# Patient Record
Sex: Male | Born: 1990 | Race: White | Hispanic: No | Marital: Single | Smoking: Never smoker
Health system: Southern US, Community
[De-identification: ages and names within clinical notes are randomized; demographics above are authoritative.]

## PROBLEM LIST (undated history)

## (undated) HISTORY — PX: FRACTURE SURGERY: SHX138

## (undated) HISTORY — PX: CHOLECYSTECTOMY: SHX55

## (undated) HISTORY — PX: TONSILLECTOMY: SUR1361

---

## 2019-07-10 ENCOUNTER — Ambulatory Visit
Admission: EM | Admit: 2019-07-10 | Discharge: 2019-07-10 | Disposition: A | Discharge status: Home / Self Care | Payer: BC Managed Care – PPO | Attending: Family Medicine | Admitting: Family Medicine

## 2019-07-10 ENCOUNTER — Encounter: Payer: Self-pay | Admitting: Emergency Medicine

## 2019-07-10 ENCOUNTER — Other Ambulatory Visit: Payer: Self-pay

## 2019-07-10 ENCOUNTER — Ambulatory Visit (INDEPENDENT_AMBULATORY_CARE_PROVIDER_SITE_OTHER): Payer: BC Managed Care – PPO

## 2019-07-10 DIAGNOSIS — S93601A Unspecified sprain of right foot, initial encounter: Secondary | ICD-10-CM

## 2019-07-10 NOTE — Discharge Instructions (Addendum)
Apply ice 20 minutes out of every 2 hours 4-5 times daily for comfort.  Feet your foot above the level of your heart sufficiently to control swelling and pain.  Take ibuprofen as necessary to control your pain.  If you continue to have symptoms make and appointment to follow-up with [facility]

## 2019-07-10 NOTE — ED Provider Notes (Signed)
[facility]    CSN: [id number] Arrival date & time: 07/10/19  1434      History   Chief Complaint Chief Complaint  Patient presents with  . Ankle Pain    right    HPI Melvin Garner is a 29 y.o. male.   HPI   29 year old male states that 10 days ago he was walking in the parking lot and suddenly twisted his right ankle into inversion. He does not know what he may have stepped on but found that he was on the ground and his ankle area was hurting very badly. 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 Then his ankle has been hurting him worse. Indicates that his pain is mostly at the base of the fifth metatarsal. Some mild swelling in this area which is slightly superior to the base of the fifth metatarsal.       History reviewed. No pertinent past medical history.  There are no problems to display for this patient.   Past Surgical History:  Procedure Laterality Date  . CHOLECYSTECTOMY    . FRACTURE SURGERY    . TONSILLECTOMY         Home Medications    Prior to Admission medications   Not on File    Family History Family History  Problem Relation Age of Onset  . Rheum arthritis Mother   . Diabetes Mother   . Fibromyalgia Mother   . Hyperlipidemia Father     Social History Social History   Tobacco Use  . Smoking status: Never Smoker  . Smokeless tobacco: Never Used  Substance Use Topics  . Alcohol use: Not Currently  . Drug use: Never     Allergies   Patient has no known allergies.   Review of Systems Review of Systems  Constitutional: Positive for activity change. Negative for appetite change, chills, diaphoresis, fatigue and fever.  Musculoskeletal: Positive for arthralgias and gait problem.  All other systems reviewed and are negative.    Physical Exam Triage Vital Signs ED Triage  Vitals  Enc Vitals Group     BP 07/10/19 1450 (!) 169/118     Pulse Rate 07/10/19 1450 (!) 105     Resp 07/10/19 1450 17     Temp 07/10/19 1450 99.1 F (37.3 C)     Temp Source 07/10/19 1450 Oral     SpO2 07/10/19 1450 100 %     Weight 07/10/19 1447 (!) 365 lb (165.6 kg)     Height 07/10/19 1447 5\' 11"  (1.803 m)     Head Circumference --      Peak Flow --      Pain Score 07/10/19 1447 5     Pain Loc --      Pain Edu? --      Excl. in GC? --    No data found.  Updated Vital Signs BP (!) 169/118 (BP Location: Left Arm)   Pulse (!) 105   Temp 99.1 F (37.3 C) (Oral)   Resp 17   Ht 5\' 11"  (1.803 m)   Wt (!) 365 lb (165.6 kg)   SpO2 100%   BMI 50.91 kg/m   Visual Acuity Right Eye Distance:   Left Eye Distance:   Bilateral Distance:    Right Eye Near:   Left Eye Near:    Bilateral  Near:     Physical Exam Vitals and nursing note reviewed.  Constitutional:      General: He is not in acute distress.    Appearance: Normal appearance. He is obese. He is not ill-appearing or toxic-appearing.  HENT:     Head: Normocephalic and atraumatic.  Eyes:     Conjunctiva/sclera: Conjunctivae normal.  Musculoskeletal:        General: Swelling, tenderness and signs of injury present.     Cervical back: Normal range of motion and neck supple.     Comments: Examination of the right foot and ankle shows swelling just superior to the is of the fifth metatarsal and fourth metatarsal. Has good range of motion of the ankle and no tenderness to the lateral or medial malleolus. There is no discomfort with the compression of the distal tib-fib. No tenderness along the fifth metatarsal the fourth metatarsal but over the lateral cuboid to be where his tenderness is concentrated.  Skin:    General: Skin is warm and dry.  Neurological:     General: No focal deficit present.     Mental Status: He is alert and oriented to person, place, and time.  Psychiatric:        Mood and Affect: Mood normal.         Behavior: Behavior normal.        Thought Content: Thought content normal.        Judgment: Judgment normal.      UC Treatments / Results  Labs (all labs ordered are listed, but only abnormal results are displayed) Labs Reviewed - No data to display  EKG   Radiology DG Foot Complete Right  Result Date: 07/10/2019 CLINICAL DATA:  Pain following inversion type injury EXAM: RIGHT FOOT COMPLETE - 3+ VIEW COMPARISON:  None. FINDINGS: Frontal, oblique, and lateral views were obtained. No fracture or dislocation. Joint spaces appear normal. No erosive change. IMPRESSION: No fracture or dislocation.  No evident arthropathy. Electronically Signed   By: Lowella Grip III M.D.   On: 07/10/2019 15:50    Procedures Procedures (including critical care time)  Medications Ordered in UC Medications - No data to display  Initial Impression / Assessment and Plan / UC Course  I have reviewed the triage vital signs and the nursing notes.  Pertinent labs & imaging results that were available during my care of the patient were reviewed by me and considered in my medical decision making (see chart for details).   29 year old male was walking in a parking lot about 10 days ago when he twisted his right ankle into inversion.  Since that time has been complaining of pain in his right ankle but actually indicates the base of the fifth metatarsal cuboid area.  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   Final Clinical Impressions(s) / UC Diagnoses   Final diagnoses:  Sprain of right foot, initial encounter     Discharge Instructions     Apply ice 20 minutes out of every 2 hours 4-5 times daily for comfort.  Feet your foot above the level of your heart sufficiently to control swelling and pain.  Take ibuprofen as necessary to control your pain.  If you continue to have symptoms make  and appointment to follow-up with [facility]    ED Prescriptions    None     PDMP not reviewed this encounter.  Lutricia Feil, PA-C 07/10/19 1956

## 2019-07-10 NOTE — ED Triage Notes (Signed)
Patient states that he was walking in the parking lot over a week and twisted his right ankle.  Patient c/o ongoing pain in his right ankle.

## 2020-12-01 ENCOUNTER — Emergency Department
Admission: EM | Admit: 2020-12-01 | Discharge: 2020-12-01 | Disposition: A | Discharge status: Home / Self Care | Payer: BC Managed Care – PPO | Attending: Emergency Medicine | Admitting: Emergency Medicine

## 2020-12-01 ENCOUNTER — Other Ambulatory Visit: Payer: Self-pay

## 2020-12-01 DIAGNOSIS — Z5321 Procedure and treatment not carried out due to patient leaving prior to being seen by health care provider: Secondary | ICD-10-CM | POA: Insufficient documentation

## 2020-12-01 DIAGNOSIS — Z8616 Personal history of COVID-19: Secondary | ICD-10-CM | POA: Insufficient documentation

## 2020-12-01 DIAGNOSIS — R197 Diarrhea, unspecified: Secondary | ICD-10-CM | POA: Insufficient documentation

## 2020-12-01 DIAGNOSIS — R42 Dizziness and giddiness: Secondary | ICD-10-CM | POA: Insufficient documentation

## 2020-12-01 LAB — COMPREHENSIVE METABOLIC PANEL
ALT: 32 U/L (ref 0–44)
AST: 24 U/L (ref 15–41)
Albumin: 4.3 g/dL (ref 3.5–5.0)
Alkaline Phosphatase: 79 U/L (ref 38–126)
Anion gap: 8 (ref 5–15)
BUN: 13 mg/dL (ref 6–20)
CO2: 27 mmol/L (ref 22–32)
Calcium: 9.3 mg/dL (ref 8.9–10.3)
Chloride: 103 mmol/L (ref 98–111)
Creatinine, Ser: 0.91 mg/dL (ref 0.61–1.24)
GFR, Estimated: >60 mL/min (ref >60)
Glucose, Bld: 87 mg/dL (ref 70–99)
Potassium: 3.8 mmol/L (ref 3.5–5.1)
Sodium: 138 mmol/L (ref 135–145)
Total Bilirubin: 0.7 mg/dL (ref 0.3–1.2)
Total Protein: 7.7 g/dL (ref 6.5–8.1)

## 2020-12-01 LAB — URINALYSIS, ROUTINE W REFLEX MICROSCOPIC
Bilirubin Urine: NEGATIVE
Glucose, UA: NEGATIVE mg/dL
Hgb urine dipstick: NEGATIVE
Ketones, ur: 5 mg/dL — AB
Leukocytes,Ua: NEGATIVE
Nitrite: NEGATIVE
Protein, ur: NEGATIVE mg/dL
Specific Gravity, Urine: 1.028 (ref 1.005–1.030)
pH: 5 (ref 5.0–8.0)

## 2020-12-01 LAB — CBC
HCT: 43.9 % (ref 39.0–52.0)
Hemoglobin: 14.8 g/dL (ref 13.0–17.0)
MCH: 30.1 pg (ref 26.0–34.0)
MCHC: 33.7 g/dL (ref 30.0–36.0)
MCV: 89.2 fL (ref 80.0–100.0)
Platelets: 324 10*3/uL (ref 150–400)
RBC: 4.92 MIL/uL (ref 4.22–5.81)
RDW: 13.1 % (ref 11.5–15.5)
WBC: 10.5 10*3/uL (ref 4.0–10.5)
nRBC: 0 % (ref 0.0–0.2)

## 2020-12-01 LAB — LIPASE, BLOOD: Lipase: 27 U/L (ref 11–51)

## 2020-12-01 NOTE — ED Triage Notes (Signed)
Pt comes with c/o diarrhea and some lightheaded. Pt also states trouble getting words out. Pt denies any belly pain.  Pt denies any recent injuries. Pt denies any CP or SOB. Pt speaking in complete sentences. Neuro neg .

## 2020-12-01 NOTE — ED Provider Notes (Signed)
Emergency Medicine Provider Triage Evaluation Note  Melvin Garner , a 30 y.o. male  was evaluated in triage.  Patient states 2 days ago he developed sudden onset of severe dizziness with a couple episodes of diarrhea.  Over the last couple days he has had some continued episodes of dizziness that are not quite as severe along with brain fog.  Patient denies any headaches, chest pain, shortness of breath.  He did have COVID 4 weeks ago.  Denies any abdominal pain nausea or vomiting.  Has not had any recent diarrhea.  Currently not dizzy Review of Systems  Positive: Dizziness, diarrhea, brain fog Negative: Fevers, chills, abdominal pain, nausea, vomiting, chest pain, shortness of breath, headache  Physical Exam  BP (!) 158/105 (BP Location: Left Arm)   Pulse 94   Temp 98.4 F (36.9 C) (Oral)   Resp 16   Ht 6' (1.829 m)   Wt (!) 188.2 kg   SpO2 100%   BMI 56.28 kg/m  Gen:   Awake, no distress alert and oriented x3 Resp:  Normal effort no respiratory distress MSK:   Moves extremities without difficulty  Other:    Medical Decision Making  Medically screening exam initiated at 6:34 PM.  Appropriate orders placed.  Leonidus Rowand was informed that the remainder of the evaluation will be completed by another provider, this initial triage assessment does not replace that evaluation, and the importance of remaining in the ED until their evaluation is complete.  Blood work, urinalysis and EKG pending.   Evon Slack, PA-C 12/01/20 1836    Merwyn Katos, MD 12/01/20 [phone]

## 2021-04-12 ENCOUNTER — Ambulatory Visit: Payer: BC Managed Care – PPO | Admitting: Dermatology

## 2021-04-13 ENCOUNTER — Other Ambulatory Visit: Payer: Self-pay

## 2021-04-13 ENCOUNTER — Ambulatory Visit: Payer: BC Managed Care – PPO | Admitting: Dermatology

## 2021-04-13 DIAGNOSIS — L408 Other psoriasis: Secondary | ICD-10-CM | POA: Diagnosis not present

## 2021-04-13 DIAGNOSIS — L409 Psoriasis, unspecified: Secondary | ICD-10-CM | POA: Diagnosis not present

## 2021-04-13 MED ORDER — VTAMA 1 % EX CREA: 1.0000 "application " | TOPICAL_CREAM | Freq: Every day | CUTANEOUS | 3 refills | Status: DC

## 2021-04-13 MED ORDER — OTEZLA 30 MG PO TABS: 30.0000 mg | ORAL_TABLET | Freq: Every day | ORAL | 1 refills | Status: DC

## 2021-04-13 NOTE — Progress Notes (Signed)
? ?  New Patient Visit ? ?Subjective  ?Melvin Garner is a 31 y.o. male who presents for the following: New Patient (Initial Visit) (New patient here today concerning a rash that started at left ankle in 2021 and spread to sacral area, both hands, feet, bilateral calves , behind knees, and other parts of body. Patient has been using over the counter treatment with no relief. ). ? ?The following portions of the chart were reviewed this encounter and updated as appropriate:  ? Tobacco  Allergies  Meds  Problems  Med Hx  Surg Hx  Fam Hx   ?  ?Review of Systems:  No other skin or systemic complaints except as noted in HPI or Assessment and Plan. ? ?Objective  ?Well appearing patient in no apparent distress; mood and affect are within normal limits. ? ?A focused examination was performed including hand, arms, legs, upper thighs, groin, buttocks, feet, ankles posterior knees, back, abdomen. Relevant physical exam findings are noted in the Assessment and Plan. ? ?bilateral ankles, bilateral feet, lower legs, behind knees, abdomen, sacral area, hands, inner thighs ? ? ? ? ? ? ? ? ? ? ? ? ? ? ? ? ? ? ? ? ? ? ? ? ? 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 ?Psoriasis is a chronic non-curable, but treatable genetic/hereditary disease that may have other systemic features affecting other organ systems such as joints (Psoriatic Arthritis). It is associated with an increased risk of inflammatory bowel disease, heart disease, non-alcoholic fatty liver disease, and depression.   ? ?Photos today  ?Some ankle pain ?Bsa 15%  ? ?Start Vtama 1 % cream - apply topically aa's of body daily for psoriasis.  Rx is not covered by current insurance rx sent to [facility] ?3 samples given in office Lot # [id number] exp Jan 2024 ? ?Start Otezla start pack take as directed  ?1 (10 mg)  tablet daily until complete , 1 (20 mg ) tab until complete, then 1 (30 mg ) tablet daily until complete. Continue with 30 mg daily until follow up.  ?2 samples otezla start packs given Lot # [id number] exp 07/13/2022 ?Rx for Otezla 30 mg (maintenance) sent to [facility]   ? ?Side effects of Otezla (apremilast) include diarrhea, nausea, headache, upper respiratory infection, depression, and weight decrease (5-10%). It should only be taken by pregnant women after a discussion regarding risks and benefits with their doctor. Goal is control of skin condition, not cure.  The use of [place] requires long term medication management, including periodic office visits. ? ?Follow up 4 - 6 weeks.  ? ?Tapinarof (VTAMA) 1 % CREA - bilateral ankles, bilateral feet, lower legs, behind knees, abdomen, sacral area, hands, inner thighs ?Apply 1 application topically daily. ? ?Apremilast (OTEZLA) 30 MG TABS - bilateral ankles, bilateral feet, lower legs, behind knees, abdomen, sacral area, hands, inner thighs ?Take 1 tablet (30 mg total) by mouth daily. ? ? ?Return for 4 - 6 week follow up on psoriasis . ? ?I, Asher Muir, CMA, am acting as scribe for Armida Sans, MD. ?Documentation: I have reviewed the above documentation for accuracy and completeness, and I agree with the above. ? ?Armida Sans, MD ? ?

## 2021-04-13 NOTE — Patient Instructions (Addendum)
Side effects of Otezla (apremilast) include diarrhea, nausea, headache, upper respiratory infection, depression, and weight decrease (5-10%). It should only be taken by pregnant women after a discussion regarding risks and benefits with their doctor. Goal is control of skin condition, not cure.  The use of Otezla requires long term medication management, including periodic office visits. ? ? ? ? ?If You Need Anything After Your Visit ? ?If you have any questions or concerns for your doctor, please call our main line at [phone] and press option 4 to reach your doctor's medical assistant. If no one answers, please leave a voicemail as directed and we will return your call as soon as possible. Messages left after 4 pm will be answered the following business day.  ? ?You may also send us a message via MyChart. We typically respond to MyChart messages within 1-2 business days. ? ?For prescription refills, please ask your pharmacy to contact our office. Our fax number is [phone]. ? ?If you have an urgent issue when the clinic is closed that cannot wait until the next business day, you can page your doctor at the number below.   ? ?Please note that while we do our best to be available for urgent issues outside of office hours, we are not available 24/7.  ? ?If you have an urgent issue and are unable to reach us, you may choose to seek medical care at your doctor's office, retail clinic, urgent care center, or emergency room. ? ?If you have a medical emergency, please immediately call 911 or go to the emergency department. ? ?Pager Numbers ? ?- Dr. Kowalski: [phone] ? ?- Dr. Moye: [phone] ? ?- Dr. Stewart: [phone] ? ?In the event of inclement weather, please call our main line at [phone] for an update on the status of any delays or closures. ? ?Dermatology Medication Tips: ?Please keep the boxes that topical medications come in in order to help keep track of the instructions about where and how  to use these. Pharmacies typically print the medication instructions only on the boxes and not directly on the medication tubes.  ? ?If your medication is too expensive, please contact our office at [phone] option 4 or send us a message through MyChart.  ? ?We are unable to tell what your co-pay for medications will be in advance as this is different depending on your insurance coverage. However, we may be able to find a substitute medication at lower cost or fill out paperwork to get insurance to cover a needed medication.  ? ?If a prior authorization is required to get your medication covered by your insurance company, please allow us 1-2 business days to complete this process. ? ?Drug prices often vary depending on where the prescription is filled and some pharmacies may offer cheaper prices. ? ?The website www.goodrx.com contains coupons for medications through different pharmacies. The prices here do not account for what the cost may be with help from insurance (it may be cheaper with your insurance), but the website can give you the price if you did not use any insurance.  ?- You can print the associated coupon and take it with your prescription to the pharmacy.  ?- You may also stop by our office during regular business hours and pick up a GoodRx coupon card.  ?- If you need your prescription sent electronically to a different pharmacy, notify our office through [place] MyChart or by phone at [phone] option 4. ? ? ? ? ?Si Usted Necesita   Algo Despu?s de Su Visita ? ?Tambi?n puede enviarnos un mensaje a trav?s de MyChart. Por lo general respondemos a los mensajes de MyChart en el transcurso de 1 a 2 d?as h?biles. ? ?Para renovar recetas, por favor pida a su farmacia que se ponga en contacto con nuestra oficina. Nuestro n?mero de fax es el [phone]. ? ?Si tiene un asunto urgente cuando la cl?nica est? cerrada y que no puede esperar hasta el siguiente d?a h?bil, puede llamar/localizar a su  doctor(a) al n?mero que aparece a continuaci?n.  ? ?Por favor, tenga en cuenta que aunque hacemos todo lo posible para estar disponibles para asuntos urgentes fuera del horario de oficina, no estamos disponibles las 24 horas del d?a, los 7 d?as de la semana.  ? ?Si tiene un problema urgente y no puede comunicarse con nosotros, puede optar por buscar atenci?n m?dica  en el consultorio de su doctor(a), en una cl?nica privada, en un centro de atenci?n urgente o en una sala de emergencias. ? ?Si tiene una emergencia m?dica, por favor llame inmediatamente al 911 o vaya a la sala de emergencias. ? ?N?meros de b?per ? ?- Dr. Kowalski: [phone] ? ?- Dra. Moye: [phone] ? ?- Dra. Stewart: [phone] ? ?En caso de inclemencias del tiempo, por favor llame a nuestra l?nea principal al [phone] para una actualizaci?n sobre el estado de cualquier retraso o cierre. ? ?Consejos para la medicaci?n en dermatolog?a: ?Por favor, guarde las cajas en las que vienen los medicamentos de uso t?pico para ayudarle a seguir las instrucciones sobre d?nde y c?mo usarlos. Las farmacias generalmente imprimen las instrucciones del medicamento s?lo en las cajas y no directamente en los tubos del medicamento.  ? ?Si su medicamento es muy caro, por favor, p?ngase en contacto con nuestra oficina llamando al [phone] y presione la opci?n 4 o env?enos un mensaje a trav?s de MyChart.  ? ?No podemos decirle cu?l ser? su copago por los medicamentos por adelantado ya que esto es diferente dependiendo de la cobertura de su seguro. Sin embargo, es posible que podamos encontrar un medicamento sustituto a menor costo o llenar un formulario para que el seguro cubra el medicamento que se considera necesario.  ? ?Si se requiere una autorizaci?n previa para que su compa??a de seguros cubra su medicamento, por favor perm?tanos de 1 a 2 d?as h?biles para completar este proceso. ? ?Los precios de los medicamentos var?an con frecuencia dependiendo del  lugar de d?nde se surte la receta y alguna farmacias pueden ofrecer precios m?s baratos. ? ?El sitio web www.goodrx.com tiene cupones para medicamentos de diferentes farmacias. Los precios aqu? no tienen en cuenta lo que podr?a costar con la ayuda del seguro (puede ser m?s barato con su seguro), pero el sitio web puede darle el precio si no utiliz? ning?n seguro.  ?- Puede imprimir el cup?n correspondiente y llevarlo con su receta a la farmacia.  ?- Tambi?n puede pasar por nuestra oficina durante el horario de atenci?n regular y recoger una tarjeta de cupones de GoodRx.  ?- Si necesita que su receta se env?e electr?nicamente a una farmacia diferente, informe a nuestra oficina a trav?s de MyChart de [place] o por tel?fono llamando al [phone] y presione la opci?n 4.  ?

## 2021-04-14 ENCOUNTER — Encounter: Payer: Self-pay | Admitting: Dermatology

## 2021-04-19 ENCOUNTER — Other Ambulatory Visit: Payer: Self-pay

## 2021-04-19 DIAGNOSIS — L409 Psoriasis, unspecified: Secondary | ICD-10-CM

## 2021-04-19 MED ORDER — VTAMA 1 % EX CREA: 1.0000 "application " | TOPICAL_CREAM | Freq: Every day | CUTANEOUS | 3 refills | Status: AC

## 2021-04-19 NOTE — Progress Notes (Signed)
Vtama sent to [facility] for better pricing. Patient agrees.  ?

## 2021-05-17 ENCOUNTER — Ambulatory Visit: Payer: BC Managed Care – PPO | Admitting: Dermatology

## 2021-05-17 ENCOUNTER — Encounter: Payer: Self-pay | Admitting: Dermatology

## 2021-05-17 DIAGNOSIS — L408 Other psoriasis: Secondary | ICD-10-CM

## 2021-05-17 DIAGNOSIS — L400 Psoriasis vulgaris: Secondary | ICD-10-CM | POA: Diagnosis not present

## 2021-05-17 MED ORDER — MOMETASONE FUROATE 0.1 % EX CREA: TOPICAL_CREAM | CUTANEOUS | 2 refills | Status: DC

## 2021-05-17 NOTE — Patient Instructions (Addendum)
Side effects of Otezla (apremilast) include diarrhea, nausea, headache, upper respiratory infection, depression, and weight decrease (5-10%). It should only be taken by pregnant women after a discussion regarding risks and benefits with their doctor. Goal is control of skin condition, not cure.  The use of [place] requires long term medication management, including periodic office visits.  ? ? ?Start Mometasone cream once daily 5 days a week. ? ?Continue Vtama once daily every day.  ? ?Increase Otezla as directed until taking 30mg  twice daily.  ? ?Gentle Skin Care Guide ? ?1. Bathe no more than once a day. ? ?2. Avoid bathing in hot water ? ?3. Use a mild soap like Dove, Vanicream, Cetaphil, CeraVe. Can use Lever 2000 or Cetaphil antibacterial soap ? ?4. Use soap only where you need it. On most days, use it under your arms, between your legs, and on your feet. Let the water rinse other areas unless visibly dirty. ? ?5. When you get out of the bath/shower, use a towel to gently blot your skin dry, don't rub it. ? ?6. While your skin is still a little damp, apply a moisturizing cream such as Vanicream, CeraVe, Cetaphil, Eucerin, Sarna lotion or plain Vaseline Jelly. For hands apply Neutrogena [place] Hand Cream or Excipial Hand Cream. ? ?7. Reapply moisturizer any time you start to itch or feel dry. ? ?8. Sometimes using free and clear laundry detergents can be helpful. Fabric softener sheets should be avoided. Downy Free & Gentle liquid, or any liquid fabric softener that is free of dyes and perfumes, it acceptable to use ? ?9. If your doctor has given you prescription creams you may apply moisturizers over them  ? ?If You Need Anything After Your Visit ? ?If you have any questions or concerns for your doctor, please call our main line at [phone] and press option 4 to reach your doctor's medical assistant. If no one answers, please leave a voicemail as directed and we will return your call as soon as possible.  Messages left after 4 pm will be answered the following business day.  ? ?You may also send [place] a message via MyChart. We typically respond to MyChart messages within 1-2 business days. ? ?For prescription refills, please ask your pharmacy to contact our office. Our fax number is [phone]. ? ?If you have an urgent issue when the clinic is closed that cannot wait until the next business day, you can page your doctor at the number below.   ? ?Please note that while we do our best to be available for urgent issues outside of office hours, we are not available 24/7.  ? ?If you have an urgent issue and are unable to reach [place], you may choose to seek medical care at your doctor's office, retail clinic, urgent care center, or emergency room. ? ?If you have a medical emergency, please immediately call 911 or go to the emergency department. ? ?Pager Numbers ? ?- Dr. Nehemiah Massed: [phone] ? ?- Dr. Laurence Ferrari: [phone] ? ?- Dr. Nicole Kindred: [phone] ? ?In the event of inclement weather, please call our main line at [phone] for an update on the status of any delays or closures. ? ?Dermatology Medication Tips: ?Please keep the boxes that topical medications come in in order to help keep track of the instructions about where and how to use these. Pharmacies typically print the medication instructions only on the boxes and not directly on the medication tubes.  ? ?If your medication is too expensive, please contact our  office at [phone] option 4 or send [place] a message through MyChart.  ? ?We are unable to tell what your co-pay for medications will be in advance as this is different depending on your insurance coverage. However, we may be able to find a substitute medication at lower cost or fill out paperwork to get insurance to cover a needed medication.  ? ?If a prior authorization is required to get your medication covered by your insurance company, please allow [place] 1-2 business days to complete this process. ? ?Drug  prices often vary depending on where the prescription is filled and some pharmacies may offer cheaper prices. ? ?The website www.goodrx.com contains coupons for medications through different pharmacies. The prices here do not account for what the cost may be with help from insurance (it may be cheaper with your insurance), but the website can give you the price if you did not use any insurance.  ?- You can print the associated coupon and take it with your prescription to the pharmacy.  ?- You may also stop by our office during regular business hours and pick up a GoodRx coupon card.  ?- If you need your prescription sent electronically to a different pharmacy, notify our office through [facility] or by phone at [phone] option 4. ? ? ? ? ?Si Usted Necesita Algo Despu?s de Su Visita ? ?Tambi?n puede enviarnos un mensaje a trav?s de MyChart. Por lo general respondemos a los mensajes de MyChart en el transcurso de 1 a 2 d?as h?biles. ? ?Para renovar recetas, por favor pida a su farmacia que se ponga en contacto con nuestra oficina. Nuestro n?mero de fax es el [phone]. ? ?Si tiene un asunto urgente cuando la cl?nica est? cerrada y que no puede esperar hasta el siguiente d?a h?bil, puede llamar/localizar a su doctor(a) al n?mero que aparece a continuaci?n.  ? ?Por favor, tenga en cuenta que aunque hacemos todo lo posible para estar disponibles para asuntos urgentes fuera del horario de oficina, no estamos disponibles las 24 horas del d?a, los 7 d?as de la semana.  ? ?Si tiene un problema urgente y no puede comunicarse con nosotros, puede optar por buscar atenci?n m?dica  en el consultorio de su doctor(a), en una cl?nica privada, en un centro de atenci?n urgente o en una sala de emergencias. ? ?Si tiene Radio broadcast assistant m?dica, por favor llame inmediatamente al 911 o vaya a la sala de emergencias. ? ?N?meros de b?per ? ?- Dr. Gwen Pounds: [phone] ? ?- Dra. Moye: [phone] ? ?- Dra. Roseanne Reno:  [phone] ? ?En caso de inclemencias del tiempo, por favor llame a nuestra l?nea principal al [phone] para una actualizaci?n sobre el estado de cualquier retraso o cierre. ? ?Consejos para la medicaci?n en dermatolog?a: ?Por favor, guarde las cajas en las que vienen los medicamentos de uso t?pico para ayudarle a seguir las instrucciones sobre d?nde y c?mo usarlos. Las farmacias generalmente imprimen las instrucciones del medicamento s?lo en las cajas y no directamente en los tubos del [place].  ? ?Si su medicamento es muy caro, por favor, p?ngase en contacto con Rolm Gala llamando al [phone] y presione la opci?n 4 o env?enos un mensaje a trav?s de MyChart.  ? ?No podemos decirle cu?l ser? su copago por los medicamentos por adelantado ya que esto es diferente dependiendo de la cobertura de su seguro. Sin embargo, es posible que podamos encontrar un medicamento sustituto a Audiological scientist un formulario para que el seguro cubra el medicamento que  se considera necesario.  ? ?Si se requiere Ardelia Mems autorizaci?n previa para que su compa??a de seguros [place] su medicamento, por favor perm?tanos de 1 a 2 d?as h?biles para completar este proceso. ? ?Los precios de los medicamentos var?an con frecuencia dependiendo del Environmental consultant de d?nde se surte la receta y alguna farmacias pueden ofrecer precios m?s baratos. ? ?El sitio web www.goodrx.com tiene cupones para medicamentos de Airline pilot. Los precios aqu? no tienen en cuenta lo que podr?a costar con la ayuda del seguro (puede ser m?s barato con su seguro), pero el sitio web puede darle el precio si no utiliz? ning?n seguro.  ?- Puede imprimir el cup?n correspondiente y llevarlo con su receta a la farmacia.  ?- Tambi?n puede pasar por nuestra oficina durante el horario de atenci?n regular y recoger una tarjeta de cupones de GoodRx.  ?- Si necesita que su receta se env?e electr?nicamente a Chiropodist, informe a nuestra oficina a trav?s de  MyChart de [place] o por tel?fono llamando al [phone] y presione la opci?n 4.  ?

## 2021-05-17 NOTE — Progress Notes (Signed)
? ?  Follow-Up Visit ?  ?Subjective  ?Melvin Garner is a 31 y.o. male who presents for the following: Psoriasis (4-6 week recheck.  Has been using Vtama daily as directed. Does not feel like this has helped much. Has used OTC HC creams, anti-itch creams, moisturizing creams. Has been taking Otezla 30mg  once daily without any adverse reactions. He states he thinks some areas have improved since starting . Has not been on any other oral medications or biologics). ? ?The following portions of the chart were reviewed this encounter and updated as appropriate:  Tobacco  Allergies  Meds  Problems  Med Hx  Surg Hx  Fam Hx   ?  ?Review of Systems: No other skin or systemic complaints except as noted in HPI or Assessment and Plan. ? ?Objective  ?Well appearing patient in no apparent distress; mood and affect are within normal limits. ? ?A focused examination was performed including face, arms, torso, groin. Relevant physical exam findings are noted in the Assessment and Plan. ? ?bilateral ankles, bilateral feet, lower legs, behind knees, abdomen, sacral area, hands, inner thighs ?Well-demarcated erythematous papules/plaques with silvery scale.  ? ? ?Assessment & Plan  ?Plaque psoriasis ?bilateral ankles, bilateral feet, lower legs, behind knees, abdomen, sacral area, hands, inner thighs ?With inverse psoriasis and palmoplantar psoriasis  ?BSA = 15% ?Psoriasis is a chronic non-curable, but treatable genetic/hereditary disease that may have other systemic features affecting other organ systems such as joints (Psoriatic Arthritis). It is associated with an increased risk of inflammatory bowel disease, heart disease, non-alcoholic fatty liver disease, and depression.    ?Chronic and persistent condition with duration or expected duration over one year. Condition is symptomatic / bothersome to patient. Not to goal. ? ?Start Mometasone cream once daily 5 days a week. ?Continue Vtama once daily every day.  ?Increase  Otezla as directed until taking 30mg  twice daily.  ? ?Per notes in chart from [place] has been approved and is being processed as of 05/16/2021.  ? ?mometasone (ELOCON) 0.1 % cream - bilateral ankles, bilateral feet, lower legs, behind knees, abdomen, sacral area, hands, inner thighs ?Apply qd to to affected body areas 5 days a week. ? ?Return in about 2 months (around 07/17/2021) for Psoriasis Follow Up. ? ?I, 07/16/2021, CMA, am acting as scribe for 09/16/2021, MD. ?Documentation: I have reviewed the above documentation for accuracy and completeness, and I agree with the above. ? ?Lawson Radar, MD ? ?

## 2021-05-19 ENCOUNTER — Encounter: Payer: Self-pay | Admitting: Dermatology

## 2021-07-26 ENCOUNTER — Ambulatory Visit: Payer: BC Managed Care – PPO | Admitting: Dermatology

## 2021-07-26 ENCOUNTER — Other Ambulatory Visit: Payer: Self-pay | Admitting: Dermatology

## 2021-07-26 ENCOUNTER — Encounter: Payer: Self-pay | Admitting: Dermatology

## 2021-07-26 DIAGNOSIS — L409 Psoriasis, unspecified: Secondary | ICD-10-CM

## 2021-07-26 DIAGNOSIS — L4050 Arthropathic psoriasis, unspecified: Secondary | ICD-10-CM | POA: Diagnosis not present

## 2021-07-26 DIAGNOSIS — L400 Psoriasis vulgaris: Secondary | ICD-10-CM | POA: Diagnosis not present

## 2021-07-26 DIAGNOSIS — L403 Pustulosis palmaris et plantaris: Secondary | ICD-10-CM | POA: Diagnosis not present

## 2021-07-26 DIAGNOSIS — L408 Other psoriasis: Secondary | ICD-10-CM

## 2021-07-26 DIAGNOSIS — Z79899 Other long term (current) drug therapy: Secondary | ICD-10-CM

## 2021-07-26 MED ORDER — OTEZLA 30 MG PO TABS: ORAL_TABLET | ORAL | 7 refills | Status: DC

## 2021-07-26 NOTE — Progress Notes (Signed)
   Follow-Up Visit   Subjective  Melvin Garner is a 31 y.o. male who presents for the following: Psoriasis (2 month recheck. On Otezla 30 mg twice daily, using Mometasone cream twice daily 5 days a week, using Vtama. Patient reports improvement. Affected areas bilateral ankles, bilateral feet, lower legs, behind knees, abdomen, sacral area, hands, inner thighs. Joint pain in knees has improved).  The following portions of the chart were reviewed this encounter and updated as appropriate:  Tobacco  Allergies  Meds  Problems  Med Hx  Surg Hx  Fam Hx     Review of Systems: No other skin or systemic complaints except as noted in HPI or Assessment and Plan.  Objective  Well appearing patient in no apparent distress; mood and affect are within normal limits.  A focused examination was performed including feet. Relevant physical exam findings are noted in the Assessment and Plan.  bilateral ankles, bilateral feet, lower legs, behind knees, abdomen, sacral area, hands, inner thighs Well-demarcated mildly erythematous papules/plaques with silvery scale.    Assessment & Plan  Plaque psoriasis bilateral ankles, bilateral feet, lower legs, behind knees, abdomen, sacral area, hands, inner thighs Plaque psoriasis With PsA, inverse psoriasis and palmoplantar psoriasis  BSA = 10% - improved from 15%.  Plaques still present but smaller and thinner today. Psoriasis is a chronic non-curable, but treatable genetic/hereditary disease that may have other systemic features affecting other organ systems such as joints (Psoriatic Arthritis). It is associated with an increased risk of inflammatory bowel disease, heart disease, non-alcoholic fatty liver disease, and depression.    Chronic and persistent condition with duration or expected duration over one year. Condition is symptomatic / bothersome to patient. Not to goal.   Continue Mometasone cream once daily 5 days a week. Continue Vtama once daily every  day.  Continue Otezla  30mg  twice daily. Pt tolerating well - No headache; diarrhea or depression reported.  Long term medication management.  Patient is using long term (months to years) prescription medication  to control their dermatologic condition.  These medications require periodic monitoring to evaluate for efficacy and side effects and may require periodic laboratory monitoring.  Related Medications mometasone (ELOCON) 0.1 % cream Apply qd to to affected body areas 5 days a week. Related Medications Tapinarof (VTAMA) 1 % CREA Apply 1 application. topically daily. Apremilast (OTEZLA) 30 MG TABS Take one tab po BID.  Return for Psoriasis Follow Up 6-7 months. Documentation: I have reviewed the above documentation for accuracy and completeness, and I agree with the above.  , MD

## 2021-07-26 NOTE — Patient Instructions (Addendum)
Continue Mometasone cream once daily 5 days a week. Continue Vtama once daily every day.  Continue Otezla  30mg  twice daily.    Topical steroids (such as triamcinolone, fluocinolone, fluocinonide, mometasone, clobetasol, halobetasol, betamethasone, hydrocortisone) can cause thinning and lightening of the skin if they are used for too long in the same area. Your physician has selected the right strength medicine for your problem and area affected on the body. Please use your medication only as directed by your physician to prevent side effects.     Side effects of Otezla (apremilast) include diarrhea, nausea, headache, upper respiratory infection, depression, and weight decrease (5-10%). It should only be taken by pregnant women after a discussion regarding risks and benefits with their doctor. Goal is control of skin condition, not cure.  The use of requires long term medication management, including periodic office visits.    Due to recent changes in healthcare laws, you may see results of your pathology and/or laboratory studies on MyChart before the doctors have had a chance to review them. We understand that in some cases there may be results that are confusing or concerning to you. Please understand that not all results are received at the same time and often the doctors may need to interpret multiple results in order to provide you with the best plan of care or course of treatment. Therefore, we ask that you please give [place] 2 business days to thoroughly review all your results before contacting the office for clarification. Should we see a critical lab result, you will be contacted sooner.   If You Need Anything After Your Visit  If you have any questions or concerns for your doctor, please call our main line at [phone] and press option 4 to reach your doctor's medical assistant. If no one answers, please leave a voicemail as directed and we will return your call as soon as  possible. Messages left after 4 pm will be answered the following business day.   You may also send [phone] a message via MyChart. We typically respond to MyChart messages within 1-2 business days.  For prescription refills, please ask your pharmacy to contact our office. Our fax number is [phone].  If you have an urgent issue when the clinic is closed that cannot wait until the next business day, you can page your doctor at the number below.    Please note that while we do our best to be available for urgent issues outside of office hours, we are not available 24/7.   If you have an urgent issue and are unable to reach [phone], you may choose to seek medical care at your doctor's office, retail clinic, urgent care center, or emergency room.  If you have a medical emergency, please immediately call 911 or go to the emergency department.  Pager Numbers  - Dr. [place]: [phone]  - Dr. [phone]: [phone]  - Dr. [phone]: [phone]  In the event of inclement weather, please call our main line at [phone] for an update on the status of any delays or closures.  Dermatology Medication Tips: Please keep the boxes that topical medications come in in order to help keep track of the instructions about where and how to use these. Pharmacies typically print the medication instructions only on the boxes and not directly on the medication tubes.   If your medication is too expensive, please contact our office at [phone] option 4 or send [phone] a message through MyChart.   We are unable to  tell what your co-pay for medications will be in advance as this is different depending on your insurance coverage. However, we may be able to find a substitute medication at lower cost or fill out paperwork to get insurance to cover a needed medication.   If a prior authorization is required to get your medication covered by your insurance company, please allow [place] 1-2 business days to complete this  process.  Drug prices often vary depending on where the prescription is filled and some pharmacies may offer cheaper prices.  The website www.goodrx.com contains coupons for medications through different pharmacies. The prices here do not account for what the cost may be with help from insurance (it may be cheaper with your insurance), but the website can give you the price if you did not use any insurance.  - You can print the associated coupon and take it with your prescription to the pharmacy.  - You may also stop by our office during regular business hours and pick up a GoodRx coupon card.  - If you need your prescription sent electronically to a different pharmacy, notify our office through [facility] or by phone at [phone] option 4.     Si Usted Necesita Algo Despus de Su Visita  Tambin puede enviarnos un mensaje a travs de Clinical cytogeneticist. Por lo general respondemos a los mensajes de MyChart en el transcurso de 1 a 2 das hbiles.  Para renovar recetas, por favor pida a su farmacia que se ponga en contacto con nuestra oficina. Annie Sable de fax es [place] [phone].  Si tiene un asunto urgente cuando la clnica est cerrada y que no puede esperar hasta el siguiente da hbil, puede llamar/localizar a su doctor(a) al nmero que aparece a continuacin.   Por favor, tenga en cuenta que aunque hacemos todo lo posible para estar disponibles para asuntos urgentes fuera del horario de [place], no estamos disponibles las 24 horas del da, los 7 [address]  [address] de la [place].   Si tiene un problema urgente y no puede comunicarse con nosotros, puede optar por buscar atencin mdica  en el consultorio de su doctor(a), en una clnica privada, en un centro de atencin urgente o en una sala de emergencias.  Si tiene Engineer, drilling, por favor llame inmediatamente al 911 o vaya a la sala de emergencias.  Nmeros de bper  - Dr. Gwen Pounds: [phone]  - Dra. Moye: [phone]  - Dra.  Roseanne Reno: [phone]  En caso de inclemencias del [place], por favor llame a Lacy Duverney principal al [phone] para una actualizacin sobre el [place] de cualquier retraso o cierre.  Consejos para la medicacin en dermatologa: Por favor, guarde las cajas en las que vienen los medicamentos de uso tpico para ayudarle a seguir las instrucciones sobre dnde y cmo usarlos. Las farmacias generalmente imprimen las instrucciones del medicamento slo en las cajas y no directamente en los tubos del [place].   Si su medicamento es muy caro, por favor, pngase en contacto con Rolm Gala llamando al [phone] y presione la opcin 4 o envenos un mensaje a travs de Clinical cytogeneticist.   No podemos decirle cul ser su copago por los medicamentos por adelantado ya que esto es diferente dependiendo de la cobertura de su seguro. Sin embargo, es posible que podamos encontrar un medicamento sustituto a Audiological scientist un formulario para que el seguro cubra el medicamento que se considera necesario.   Si se requiere una autorizacin previa para que su compaa de seguros [place]  su medicamento, por favor permtanos de 1 a 2 das hbiles para completar [address] proces[address].  Los precios de los medicamentos varan con frecuencia dependiendo del Environmental consultantlugar de dnde se surte la receta y alguna farmacias pueden ofrecer precios ms baratos.  El sitio web www.goodrx.com tiene cupones para medicamentos de Health and safety inspectordiferentes farmacias. Los precios aqu no tienen en cuenta lo que podra costar con la ayuda del seguro (puede ser ms barato con su seguro), pero el sitio web puede darle el precio si no utiliz Tourist information centre managerningn seguro.  - Puede imprimir el cupn correspondiente y llevarlo con su receta a la farmacia.  - Tambin puede pasar por nuestra oficina durante el horario de atencin regular y Education officer, museumrecoger una tarjeta de cupones de GoodRx.  - Si necesita que su receta se enve electrnicamente a una farmacia diferente, informe a nuestra oficina a  travs de MyChart de [place] o por telfono llamando al [phone] y presione la opcin 4.

## 2021-07-28 ENCOUNTER — Encounter: Payer: Self-pay | Admitting: Dermatology

## 2021-09-20 IMAGING — CR DG FOOT COMPLETE 3+V*R*
3 series · 3 of 3 positions shown · non-contrast
Comparison: None.

CLINICAL DATA: Pain following inversion type injury

EXAM:
RIGHT FOOT COMPLETE - 3+ VIEW

[foot ap]
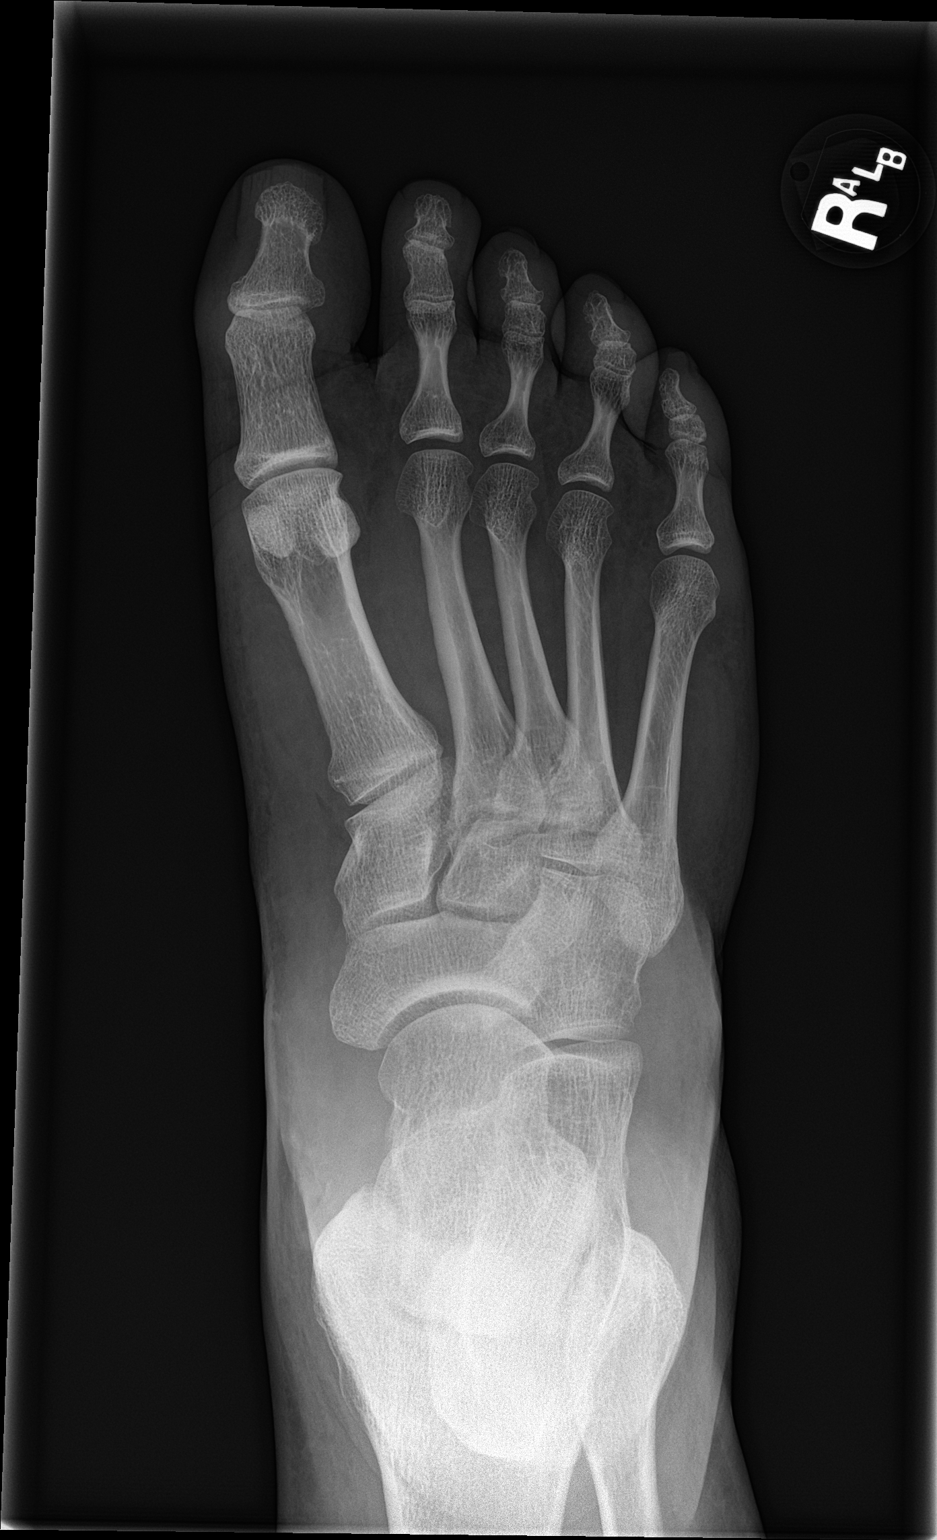

[foot obl]
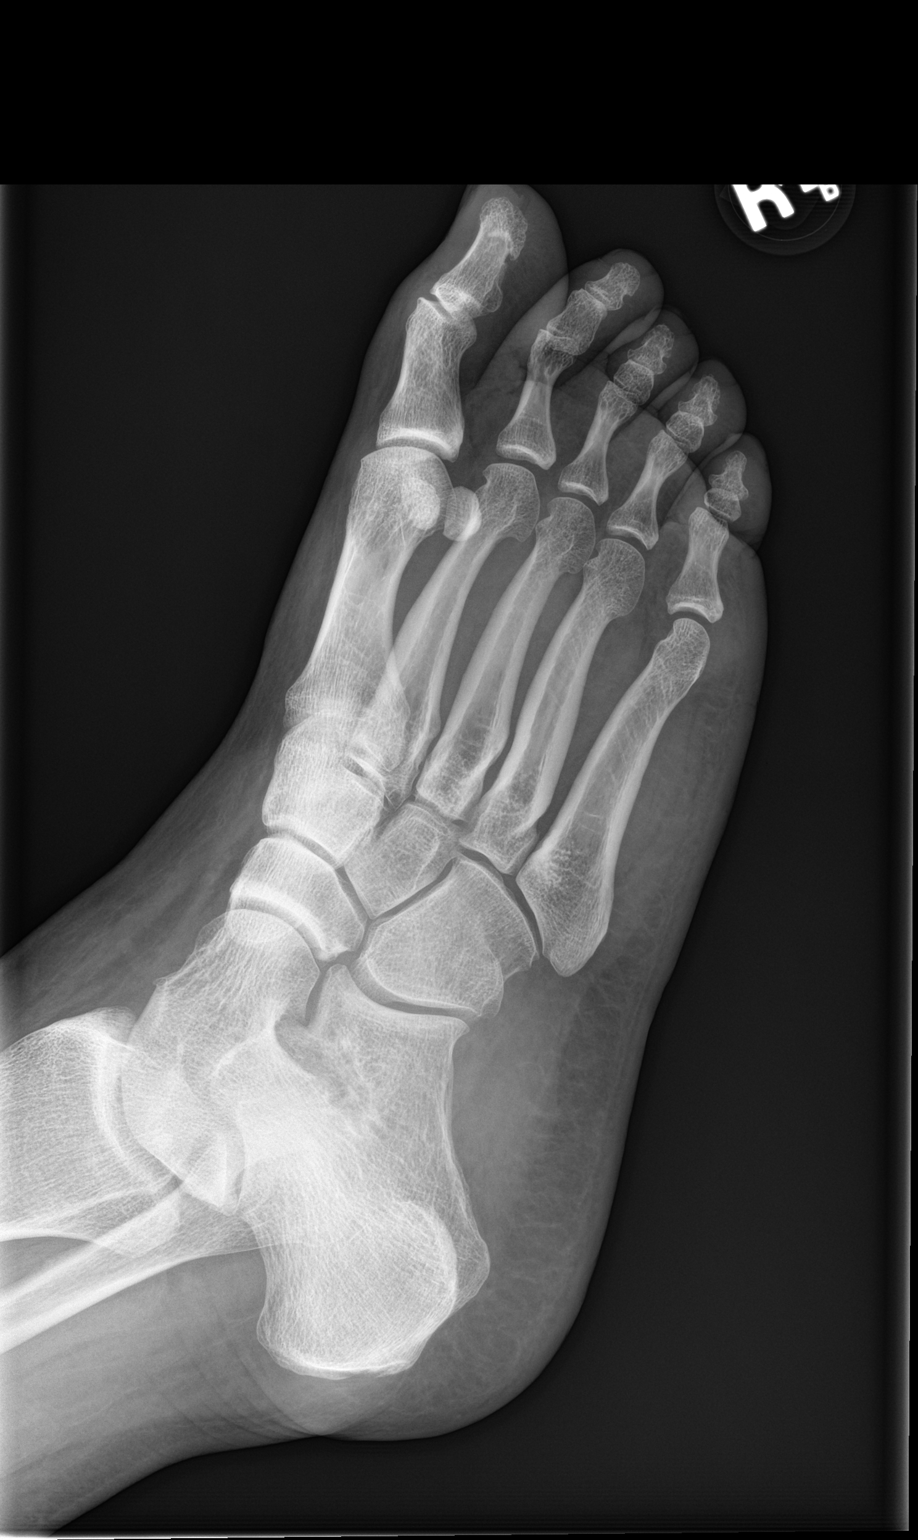

[foot lat]
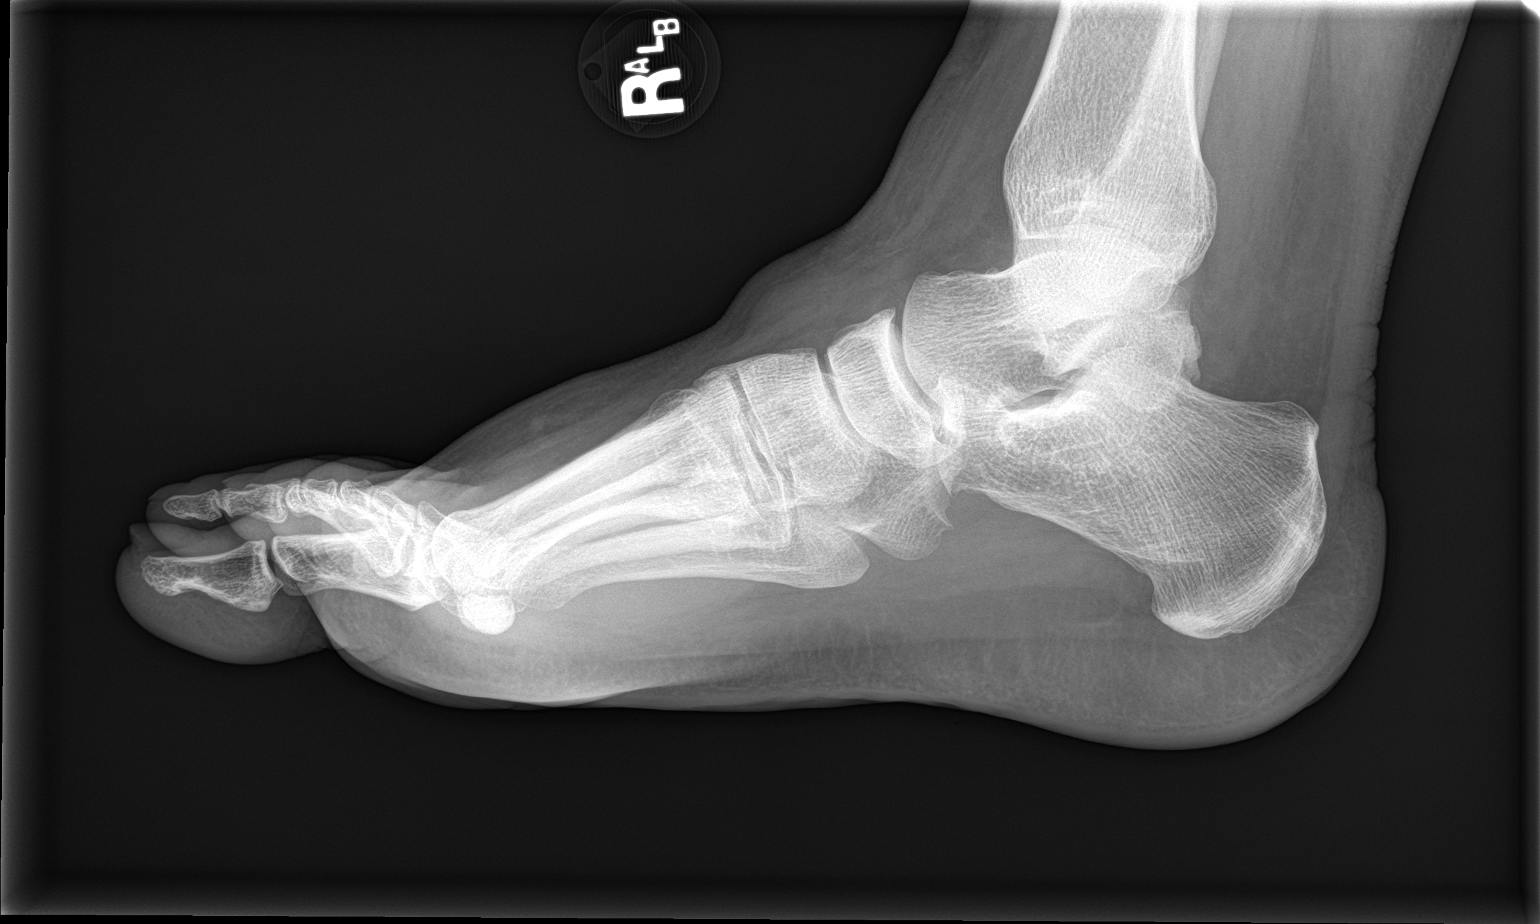

[3 of 3 positions shown; findings below may reference images not displayed]

FINDINGS: Frontal, oblique, and lateral views were obtained. No fracture or
dislocation. Joint spaces appear normal. No erosive change.
IMPRESSION: No fracture or dislocation.  No evident arthropathy.

## 2022-02-01 ENCOUNTER — Other Ambulatory Visit: Payer: Self-pay | Admitting: Dermatology

## 2022-02-01 DIAGNOSIS — L409 Psoriasis, unspecified: Secondary | ICD-10-CM

## 2022-02-15 ENCOUNTER — Ambulatory Visit: Payer: BC Managed Care – PPO | Admitting: Dermatology

## 2022-02-19 ENCOUNTER — Ambulatory Visit: Payer: BC Managed Care – PPO | Admitting: Dermatology

## 2022-02-26 ENCOUNTER — Other Ambulatory Visit: Payer: Self-pay | Admitting: Dermatology

## 2022-02-26 DIAGNOSIS — L409 Psoriasis, unspecified: Secondary | ICD-10-CM

## 2022-03-28 ENCOUNTER — Ambulatory Visit: Payer: BC Managed Care – PPO | Admitting: Dermatology

## 2022-04-04 ENCOUNTER — Other Ambulatory Visit: Payer: Self-pay | Admitting: Dermatology

## 2022-04-04 DIAGNOSIS — L409 Psoriasis, unspecified: Secondary | ICD-10-CM

## 2022-04-11 ENCOUNTER — Encounter: Payer: Self-pay | Admitting: Dermatology

## 2022-04-11 ENCOUNTER — Ambulatory Visit: Payer: BC Managed Care – PPO | Admitting: Dermatology

## 2022-04-11 VITALS — BP 172/77

## 2022-04-11 DIAGNOSIS — L409 Psoriasis, unspecified: Secondary | ICD-10-CM | POA: Diagnosis not present

## 2022-04-11 DIAGNOSIS — Z7189 Other specified counseling: Secondary | ICD-10-CM | POA: Diagnosis not present

## 2022-04-11 DIAGNOSIS — Z79899 Other long term (current) drug therapy: Secondary | ICD-10-CM | POA: Diagnosis not present

## 2022-04-11 DIAGNOSIS — L4050 Arthropathic psoriasis, unspecified: Secondary | ICD-10-CM

## 2022-04-11 MED ORDER — OTEZLA 30 MG PO TABS: 1.0000 | ORAL_TABLET | Freq: Two times a day (BID) | ORAL | 6 refills | Status: AC

## 2022-04-11 MED ORDER — ZORYVE 0.3 % EX CREA: 1.0000 "application " | TOPICAL_CREAM | Freq: Every day | CUTANEOUS | 6 refills | Status: AC

## 2022-04-11 NOTE — Progress Notes (Signed)
   Follow-Up Visit   Subjective  Melvin Garner is a 32 y.o. male who presents for the following: Psoriasis (8 month follow up - Otezla 30 mg bid - no depression, headaches, GI upset or diarrhea. Still has some spots but they are much thinner than before he started [facility]  (04/2021). He is having some backaches and would like a referral to a rheumatologist).  The following portions of the chart were reviewed this encounter and updated as appropriate:   Tobacco  Allergies  Meds  Problems  Med Hx  Surg Hx  Fam Hx     Review of Systems:  No other skin or systemic complaints except as noted in HPI or Assessment and Plan.  Objective  Well appearing patient in no apparent distress; mood and affect are within normal limits.  A focused examination was performed including arms/hands, legs/feet. Relevant physical exam findings are noted in the Assessment and Plan.  Mild hyperkeratosis of feet. Mild pinkness of ankles. Small plaques of lower legs. Hands are normal today.   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 Condition is bothersome/symptomatic for patient. Currently flared.  Counseling on psoriasis and coordination of care  psoriasis is a chronic non-curable, but treatable genetic/hereditary disease that may have other systemic features affecting other organ systems such as joints (Psoriatic Arthritis). It is associated with an increased risk of inflammatory bowel disease, heart disease, non-alcoholic fatty liver disease, and depression.  Treatments include light and laser treatments; topical medications; and systemic medications including oral and injectables. Chronic and persistent condition with duration or expected duration over one year. Condition is symptomatic / bothersome to patient. Not to goal.  Long term medication management.  Patient is using long term (months to years)  prescription medication  to control their dermatologic condition.  These medications require periodic monitoring to evaluate for efficacy and side effects and may require periodic laboratory monitoring.   Continue Otezla 30 mg 1 po bid, Mometasone cream qd up to 5 days per week prn flares Start Zoryve cream qd  Will send referral to Coralie Carpen at [facility] to evaluate for Psoriatic Arthritis  Ambulatory referral to Rheumatology  Roflumilast (ZORYVE) 0.3 % CREA Apply 1 application  topically daily.  Related Medications Tapinarof (VTAMA) 1 % CREA Apply 1 application. topically daily.  Apremilast (OTEZLA) 30 MG TABS Take 1 tablet (30 mg total) by mouth 2 (two) times daily.  Return for Follow up as scheduled.  I, Ashok Cordia, CMA, am acting as scribe for Sarina Ser, MD . Documentation: I have reviewed the above documentation for accuracy and completeness, and I agree with the above.  Sarina Ser, MD

## 2022-04-11 NOTE — Patient Instructions (Signed)
Your prescription was sent to [facility] in [place]. A representative from [place] will contact you within 3 business hours to verify your address and insurance information to schedule a free delivery. If for any reason you do not receive a phone call from them, please reach out to them. Their phone number is [phone] and their hours are Monday-Friday 9:00 am-5:00 pm.     Due to recent changes in healthcare laws, you may see results of your pathology and/or laboratory studies on MyChart before the doctors have had a chance to review them. We understand that in some cases there may be results that are confusing or concerning to you. Please understand that not all results are received at the same time and often the doctors may need to interpret multiple results in order to provide you with the best plan of care or course of treatment. Therefore, we ask that you please give [place] 2 business days to thoroughly review all your results before contacting the office for clarification. Should we see a critical lab result, you will be contacted sooner.   If You Need Anything After Your Visit  If you have any questions or concerns for your doctor, please call our main line at [phone] and press option 4 to reach your doctor's medical assistant. If no one answers, please leave a voicemail as directed and we will return your call as soon as possible. Messages left after 4 pm will be answered the following business day.   You may also send [place] a message via [place]. We typically respond to MyChart messages within 1-2 business days.  For prescription refills, please ask your pharmacy to contact our office. Our fax number is [phone].  If you have an urgent issue when the clinic is closed that cannot wait until the next business day, you can page your doctor at the number below.    Please note that while we do our best to be available for urgent issues outside of office hours, we are not  available 24/7.   If you have an urgent issue and are unable to reach [place], you may choose to seek medical care at your doctor's office, retail clinic, urgent care center, or emergency room.  If you have a medical emergency, please immediately call 911 or go to the emergency department.  Pager Numbers  - Dr. Nehemiah Massed: [phone]  - Dr. Laurence Ferrari: [phone]  - Dr. Nicole Kindred: [phone]  In the event of inclement weather, please call our main line at [phone] for an update on the status of any delays or closures.  Dermatology Medication Tips: Please keep the boxes that topical medications come in in order to help keep track of the instructions about where and how to use these. Pharmacies typically print the medication instructions only on the boxes and not directly on the medication tubes.   If your medication is too expensive, please contact our office at [phone] option 4 or send [place] a message through [place].   We are unable to tell what your co-pay for medications will be in advance as this is different depending on your insurance coverage. However, we may be able to find a substitute medication at lower cost or fill out paperwork to get insurance to cover a needed medication.   If a prior authorization is required to get your medication covered by your insurance company, please allow [place] 1-2 business days to complete this process.  Drug prices often vary depending on where the prescription is filled  and some pharmacies may offer cheaper prices.  The website www.goodrx.com contains coupons for medications through different pharmacies. The prices here do not account for what the cost may be with help from insurance (it may be cheaper with your insurance), but the website can give you the price if you did not use any insurance.  - You can print the associated coupon and take it with your prescription to the pharmacy.  - You may also stop by our office during regular business hours  and pick up a GoodRx coupon card.  - If you need your prescription sent electronically to a different pharmacy, notify our office through [facility] or by phone at [phone] option 4.     Si Usted Necesita Algo Despus de Su Visita  Tambin puede enviarnos un mensaje a travs de Pharmacist, community. Por lo general respondemos a los mensajes de MyChart en el transcurso de 1 a 2 das hbiles.  Para renovar recetas, por favor pida a su farmacia que se ponga en contacto con nuestra oficina. Harland Dingwall de fax es [place] [phone].  Si tiene un asunto urgente cuando la clnica est cerrada y que no puede esperar hasta el siguiente da hbil, puede llamar/localizar a su doctor(a) al nmero que aparece a continuacin.   Por favor, tenga en cuenta que aunque hacemos todo lo posible para estar disponibles para asuntos urgentes fuera del horario de [place], no estamos disponibles las 24 horas del da, los 7 das de la [place].   Si tiene un problema urgente y no puede comunicarse con nosotros, puede optar por buscar atencin mdica  en el consultorio de su doctor(a), en una clnica privada, en un centro de atencin urgente o en una sala de emergencias.  Si tiene Engineering geologist, por favor llame inmediatamente al 911 o vaya a la sala de emergencias.  Nmeros de bper  - Dr. Nehemiah Massed: [phone]  - Dra. Moye: [phone]  - Dra. Nicole Kindred: [phone]  En caso de inclemencias del [place], por favor llame a Johnsie Kindred principal al [phone] para una actualizacin sobre el [place] de cualquier retraso o cierre.  Consejos para la medicacin en dermatologa: Por favor, guarde las cajas en las que vienen los medicamentos de uso tpico para ayudarle a seguir las instrucciones sobre dnde y cmo usarlos. Las farmacias generalmente imprimen las instrucciones del medicamento slo en las cajas y no directamente en los tubos del [place].   Si su medicamento es muy caro, por favor, pngase  en contacto con Zigmund Daniel llamando al [phone] y presione la opcin 4 o envenos un mensaje a travs de Pharmacist, community.   No podemos decirle cul ser su copago por los medicamentos por adelantado ya que esto es diferente dependiendo de la cobertura de su seguro. Sin embargo, es posible que podamos encontrar un medicamento sustituto a Electrical engineer un formulario para que el seguro cubra el medicamento que se considera necesario.   Si se requiere una autorizacin previa para que su compaa de seguros [place] su medicamento, por favor permtanos de 1 a 2 das hbiles para completar este proceso.  Los precios de los medicamentos varan con frecuencia dependiendo del Environmental consultant de dnde se surte la receta y alguna farmacias pueden ofrecer precios ms baratos.  El sitio web www.goodrx.com tiene cupones para medicamentos de Airline pilot. Los precios aqu no tienen en cuenta lo que podra costar con la ayuda del seguro (puede ser ms barato con su seguro), pero el sitio web puede darle el  precio si no Field seismologist.  - Puede imprimir el cupn correspondiente y llevarlo con su receta a la farmacia.  - Tambin puede pasar por nuestra oficina durante el horario de atencin regular y Charity fundraiser una tarjeta de cupones de GoodRx.  - Si necesita que su receta se enve electrnicamente a una farmacia diferente, informe a nuestra oficina a travs de MyChart de [place] o por telfono llamando al [phone] y presione la opcin 4.

## 2022-05-02 ENCOUNTER — Other Ambulatory Visit: Payer: Self-pay | Admitting: Dermatology

## 2022-05-02 DIAGNOSIS — L400 Psoriasis vulgaris: Secondary | ICD-10-CM

## 2022-10-25 ENCOUNTER — Ambulatory Visit: Payer: BC Managed Care – PPO | Admitting: Dermatology
